# Patient Record
Sex: Male | Born: 2000 | Race: Black or African American | Hispanic: No | Marital: Single | Smoking: Current some day smoker
Health system: Southern US, Community
[De-identification: ages and names within clinical notes are randomized; demographics above are authoritative.]

---
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
## 2019-01-22 IMAGING — US US SCROTUM W/ DOPPLER COMPLETE
1 series · 13 of 25 positions shown · non-contrast
Comparison: None.

CLINICAL DATA: Initial evaluation for acute testicular pain,
fluctuance, possible abscess.

EXAM:
SCROTAL ULTRASOUND
DOPPLER ULTRASOUND OF THE TESTICLES
TECHNIQUE: Complete ultrasound examination of the testicles, epididymis, and
other scrotal structures was performed. Color and spectral Doppler
ultrasound were also utilized to evaluate blood flow to the
testicles.

[Series 1: us scrotum w/ doppler complete · 0.06mm/px · 13 of 55 slices shown]
[im 1/55]
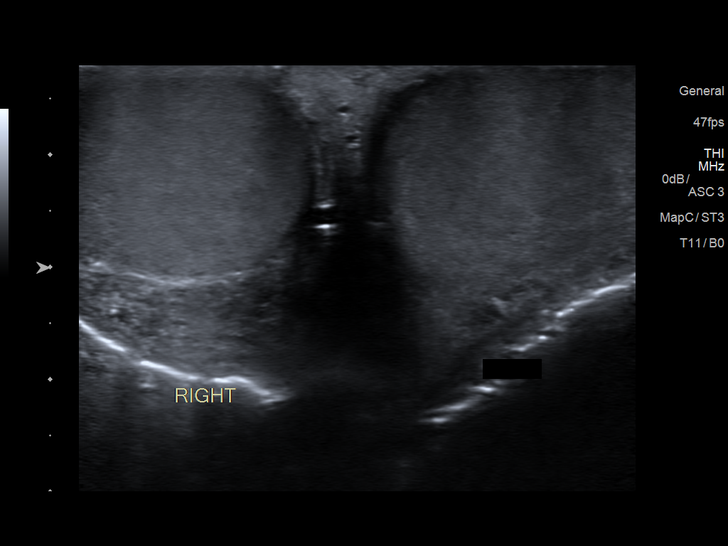
[im 5/55]
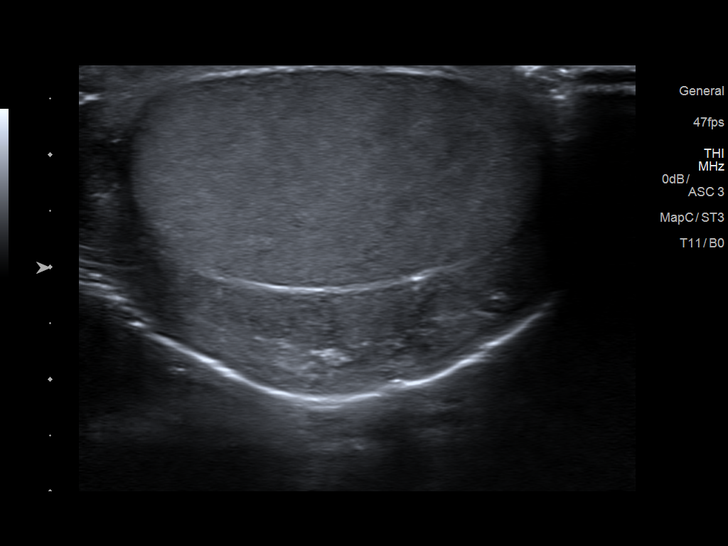
[im 10/55]
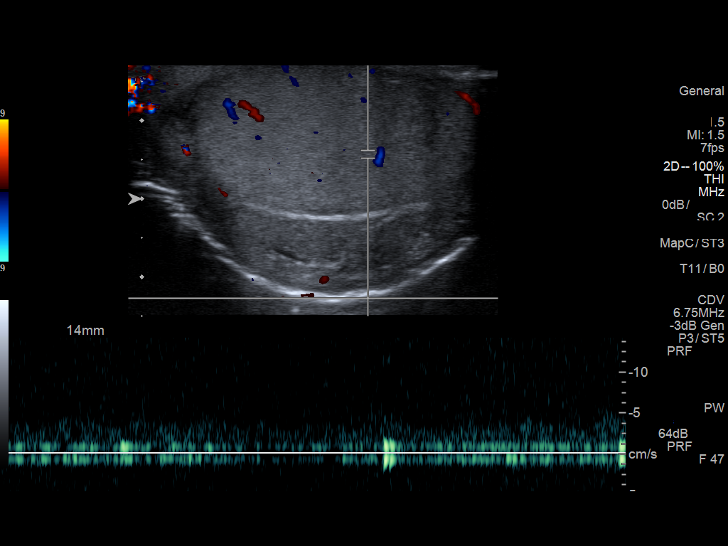
[im 14/55]
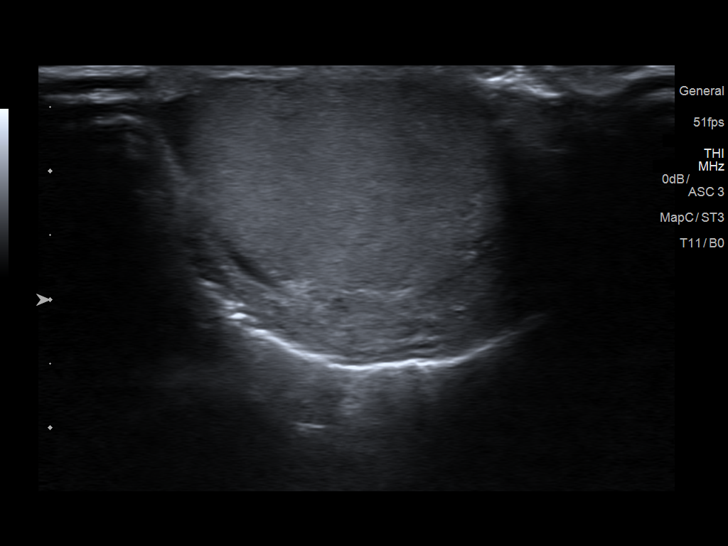
[im 19/55]
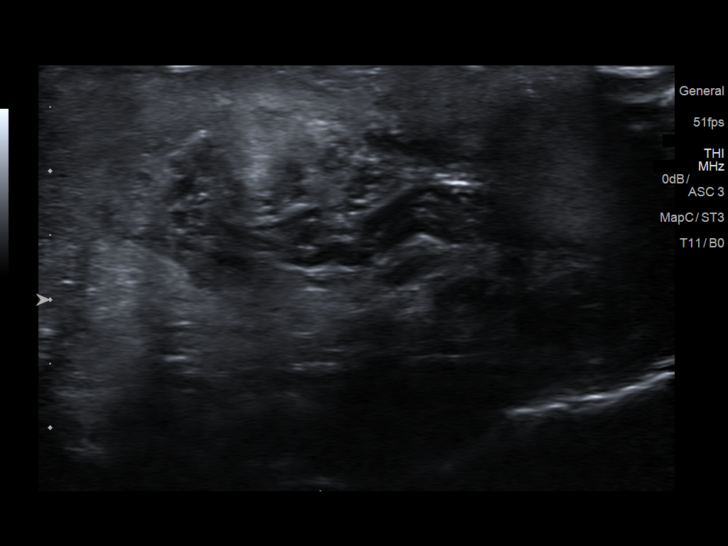
[im 23/55]
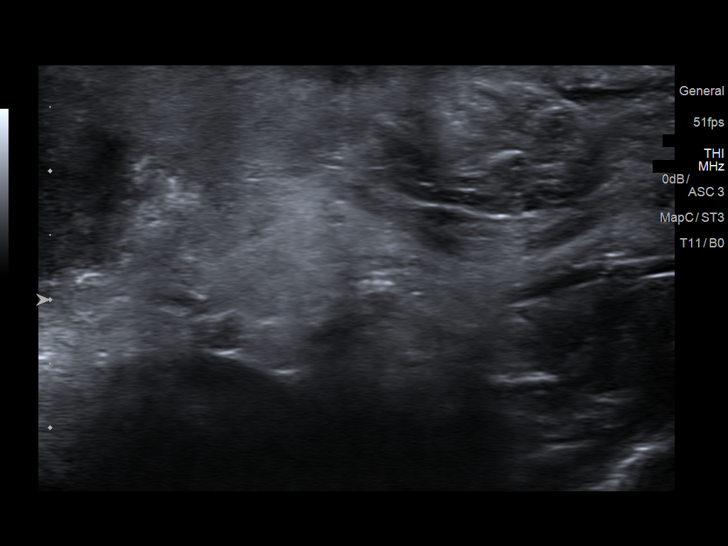
[im 28/55]
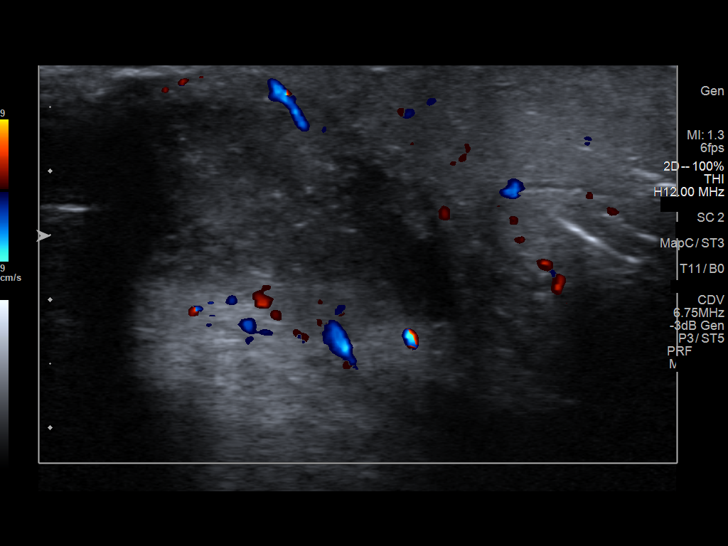
[im 32/55]
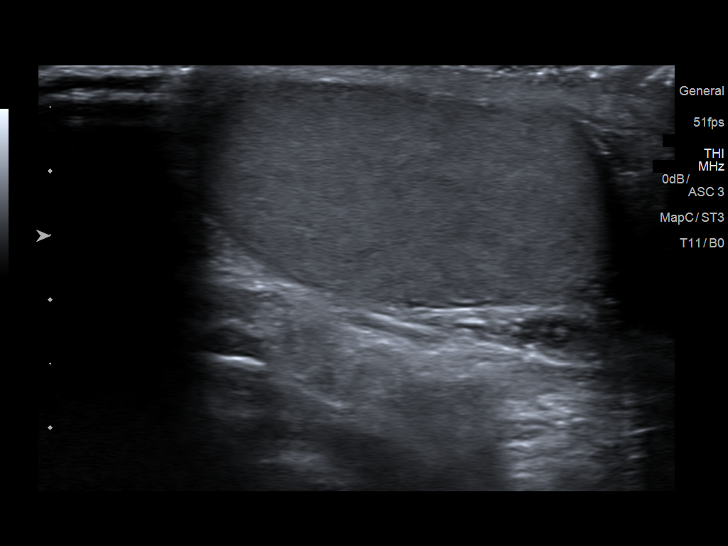
[im 37/55]
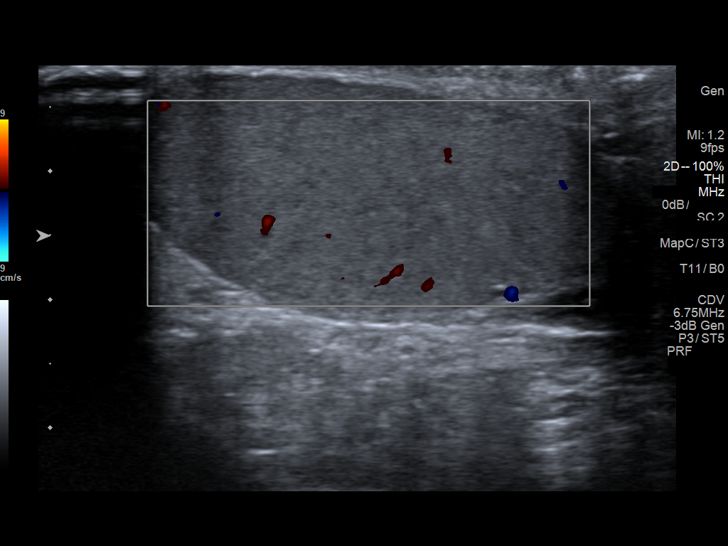
[im 41/55]
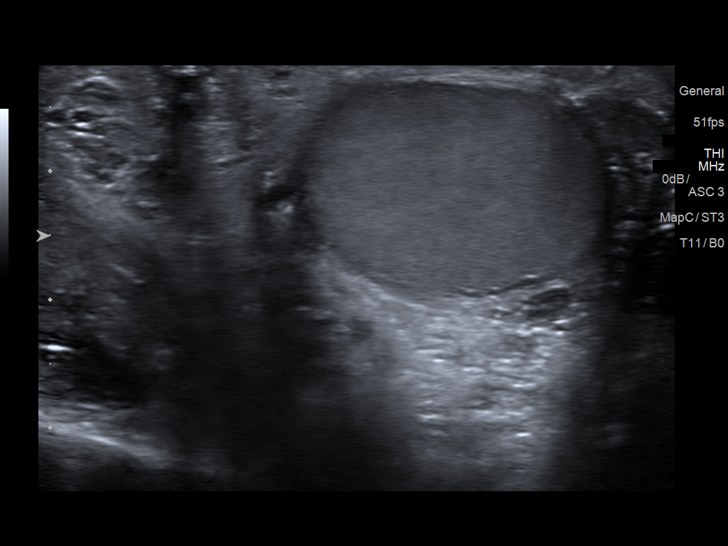
[im 46/55]
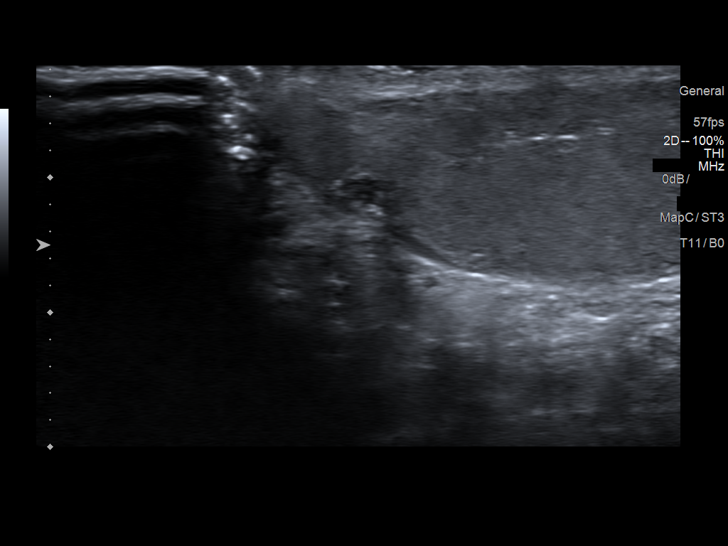
[im 50/55]
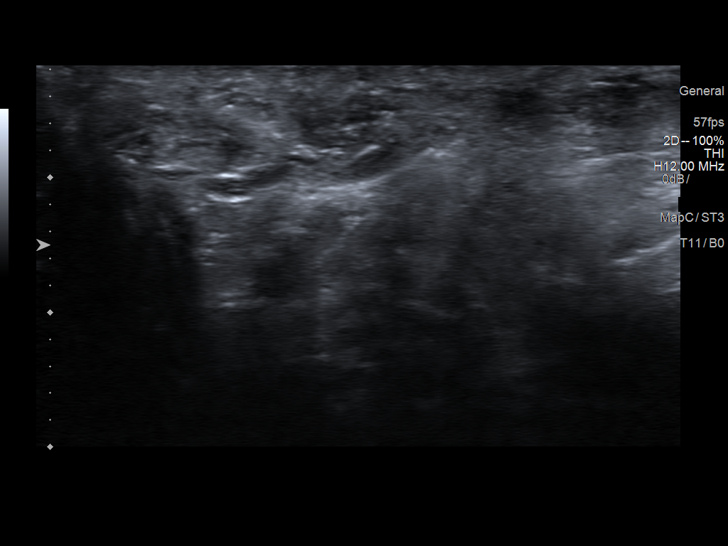
[im 55/55]
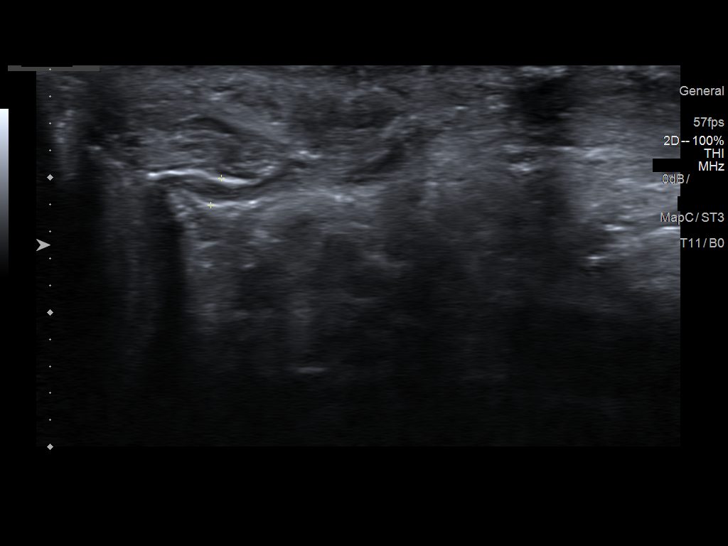

[13 of 25 positions shown; findings below may reference images not displayed]

FINDINGS: Right testicle

Measurements: 3.6 x 2.0 x 2.9 cm. No mass or microlithiasis
visualized.

Left testicle

Measurements: 3.6 x 1.7 x 2.4 cm. No mass or microlithiasis
visualized.

Right epididymis:  Normal in size and appearance.

Left epididymis: Normal in size and appearance. 4 mm simple cyst
present at the left epididymal head, felt to be incidental and of no
clinical significance.

Hydrocele:  None visualized.

Varicocele:  None visualized.

Pulsed Doppler interrogation of both testes demonstrates normal low
resistance arterial and venous waveforms bilaterally.

3.3 x 1.9 x 2.4 cm complex hypoechoic lesion/collection at the right
penile base, suspicious for phlegmon and/or early abscess. This is
positioned approximately 2 mm deep to the skin.
IMPRESSION: 1. 3.3 x 1.9 x 2.4 cm complex hypoechoic lesion/collection at the
right P now base, suspicious for phlegmon and/or early abscess.
Correlation with physical exam recommended.
2. Otherwise negative scrotal ultrasound. No evidence for acute
epididymo-orchitis.

## 2019-04-10 ENCOUNTER — Encounter (HOSPITAL_COMMUNITY): Payer: Self-pay

## 2019-04-10 ENCOUNTER — Other Ambulatory Visit: Payer: Self-pay

## 2019-04-10 ENCOUNTER — Ambulatory Visit (HOSPITAL_COMMUNITY)
Admission: EM | Admit: 2019-04-10 | Discharge: 2019-04-10 | Disposition: A | Discharge status: Home / Self Care | Payer: Medicaid Other | Attending: Family Medicine | Admitting: Family Medicine

## 2019-04-10 DIAGNOSIS — L0291 Cutaneous abscess, unspecified: Secondary | ICD-10-CM

## 2019-04-10 DIAGNOSIS — N492 Inflammatory disorders of scrotum: Secondary | ICD-10-CM

## 2019-04-10 MED ORDER — DOXYCYCLINE HYCLATE 100 MG PO CAPS: 100.0000 mg | ORAL_CAPSULE | Freq: Two times a day (BID) | ORAL | 0 refills | Status: DC

## 2019-04-10 MED ORDER — HYDROCODONE-ACETAMINOPHEN 5-325 MG PO TABS: 1.0000 | ORAL_TABLET | Freq: Four times a day (QID) | ORAL | 0 refills | Status: DC | PRN

## 2019-04-10 NOTE — Discharge Instructions (Signed)
Take the antibiotic as directed Take the pain medicine as needed This may continue to drain a couple of days Return if needed

## 2019-04-10 NOTE — ED Provider Notes (Signed)
[facility]    CSN: [id number] Arrival date & time: 04/10/19  [id number]      History   Chief Complaint Chief Complaint  Patient presents with  . Abscess    Left Leg    HPI Jonathan Vazquez is a 18 y.o. male.   HPI  Abscess on left scrotum for a week Very painful no drainage No fever    History reviewed. No pertinent past medical history.  There are no active problems to display for this patient.   History reviewed. No pertinent surgical history.     Home Medications    Prior to Admission medications   Medication Sig Start Date End Date Taking? Authorizing Provider  acetaminophen (TYLENOL) 500 MG tablet Take 500 mg by mouth every 6 (six) hours as needed. For fever    [provider]  doxycycline (VIBRAMYCIN) 100 MG capsule Take 1 capsule (100 mg total) by mouth 2 (two) times daily. 04/10/19   Eustace MooreNelson, Deundra Bard Sue, MD  HYDROcodone-acetaminophen (NORCO/VICODIN) 5-325 MG tablet Take 1 tablet by mouth every 6 (six) hours as needed for moderate pain or severe pain. 04/10/19   Eustace MooreNelson, Scotlyn Mccranie Sue, MD  ibuprofen (ADVIL,MOTRIN) 200 MG tablet Take 400 mg by mouth every 6 (six) hours as needed.    [provider]  naproxen (NAPROSYN) 375 MG tablet Take 1 tablet (375 mg total) by mouth 2 (two) times daily. 02/18/18   Rennis HardingWurst, Brittany, PA-C    Family History Family History  Family history unknown: Yes    Social History Social History   Tobacco Use  . Smoking status: Current Some Day Smoker    Types: Cigarettes  . Smokeless tobacco: Never Used  . Tobacco comment: a couple cigs/day  Substance Use Topics  . Alcohol use: Yes    Frequency: Never  . Drug use: Yes    Types: Marijuana     Allergies   Patient has no known allergies.   Review of Systems Review of Systems  Constitutional: Negative for chills and fever.  HENT: Negative for ear pain and sore throat.   Eyes: Negative for pain and visual disturbance.  Respiratory: Negative for cough and  shortness of breath.   Cardiovascular: Negative for chest pain and palpitations.  Gastrointestinal: Negative for abdominal pain and vomiting.  Genitourinary: Negative for dysuria and hematuria.  Musculoskeletal: Negative for arthralgias and back pain.  Skin: Positive for wound. Negative for color change and rash.  Neurological: Negative for seizures and syncope.  All other systems reviewed and are negative.    Physical Exam Triage Vital Signs ED Triage Vitals  Enc Vitals Group     BP 04/10/19 0828 108/62     Pulse Rate 04/10/19 0828 91     Resp 04/10/19 0828 18     Temp 04/10/19 0828 98.1 F (36.7 C)     Temp Source 04/10/19 0828 Oral     SpO2 04/10/19 0828 95 %     Weight --      Height --      Head Circumference --      Peak Flow --      Pain Score 04/10/19 0829 10     Pain Loc --      Pain Edu? --      Excl. in GC? --    No data found.  Updated Vital Signs BP 108/62 (BP Location: Left Arm)   Pulse 91   Temp 98.1 F (36.7 C) (Oral)   Resp 18   SpO2  95%   Visual Acuity Right Eye Distance:   Left Eye Distance:   Bilateral Distance:    Right Eye Near:   Left Eye Near:    Bilateral Near:     Physical Exam Constitutional:      General: He is not in acute distress.    Appearance: He is well-developed.  HENT:     Head: Normocephalic and atraumatic.  Eyes:     Conjunctiva/sclera: Conjunctivae normal.     Pupils: Pupils are equal, round, and reactive to light.  Neck:     Musculoskeletal: Normal range of motion.  Cardiovascular:     Rate and Rhythm: Normal rate.  Pulmonary:     Effort: Pulmonary effort is normal. No respiratory distress.  Abdominal:     General: There is no distension.     Palpations: Abdomen is soft.  Genitourinary:   Musculoskeletal: Normal range of motion.  Skin:    General: Skin is warm and dry.  Neurological:     Mental Status: He is alert.      UC Treatments / Results  Labs (all labs ordered are listed, but only abnormal  results are displayed) Labs Reviewed - No data to display  EKG   Radiology No results found.  Procedures Incision and Drainage  Date/Time: 04/10/2019 12:18 PM Performed by: Eustace Moore, MD Authorized by: Eustace Moore, MD   Consent:    Consent obtained:  Verbal   Consent given by:  Patient   Risks discussed:  Infection and incomplete drainage   Alternatives discussed:  No treatment Location:    Type:  Abscess   Location:  Anogenital   Anogenital location:  Scrotal space Pre-procedure details:    Skin preparation:  Betadine Anesthesia (see MAR for exact dosages):    Anesthesia method:  Local infiltration   Local anesthetic:  Lidocaine 1% w/o epi Procedure type:    Complexity:  Simple Procedure details:    Incision types:  Stab incision   Scalpel blade:  11   Wound management:  Probed and deloculated   Drainage:  Purulent   Drainage amount:  Copious Post-procedure details:    Patient tolerance of procedure:  Tolerated with difficulty   (including critical care time)  Medications Ordered in UC Medications - No data to display  Initial Impression / Assessment and Plan / UC Course  I have reviewed the triage vital signs and the nursing notes.  Pertinent labs & imaging results that were available during my care of the patient were reviewed by me and considered in my medical decision making (see chart for details).      Final Clinical Impressions(s) / UC Diagnoses   Final diagnoses:  Abscess     Discharge Instructions     Take the antibiotic as directed Take the pain medicine as needed This may continue to drain a couple of days Return if needed   ED Prescriptions    Medication Sig Dispense Auth. Provider   HYDROcodone-acetaminophen (NORCO/VICODIN) 5-325 MG tablet Take 1 tablet by mouth every 6 (six) hours as needed for moderate pain or severe pain. 8 tablet Eustace Moore, MD   doxycycline (VIBRAMYCIN) 100 MG capsule Take 1 capsule (100  mg total) by mouth 2 (two) times daily. 14 capsule Eustace Moore, MD     Controlled Substance Prescriptions [place] Controlled Substance Registry consulted? Yes, I have consulted the [place] Controlled Substances Registry for this patient, and feel the risk/benefit ratio today is favorable for proceeding with this  prescription for a controlled substance.   Raylene Everts, MD 04/10/19 [phone]

## 2019-04-10 NOTE — ED Triage Notes (Signed)
Pt presents with abscess on left leg X 1 week.

## 2019-04-21 ENCOUNTER — Ambulatory Visit (HOSPITAL_COMMUNITY)
Admission: EM | Admit: 2019-04-21 | Discharge: 2019-04-21 | Disposition: A | Discharge status: Home / Self Care | Payer: Medicaid Other | Attending: Family Medicine | Admitting: Family Medicine

## 2019-04-21 ENCOUNTER — Encounter (HOSPITAL_COMMUNITY): Payer: Self-pay | Admitting: Emergency Medicine

## 2019-04-21 ENCOUNTER — Other Ambulatory Visit: Payer: Self-pay

## 2019-04-21 DIAGNOSIS — L0291 Cutaneous abscess, unspecified: Secondary | ICD-10-CM

## 2019-04-21 MED ORDER — HYDROCODONE-ACETAMINOPHEN 5-325 MG PO TABS
1.0000 | ORAL_TABLET | Freq: Once | ORAL | Status: AC
  Administered 2019-04-21: 1 via ORAL

## 2019-04-21 MED ORDER — HYDROCODONE-ACETAMINOPHEN 5-325 MG PO TABS
ORAL_TABLET | ORAL | Status: AC
  Filled 2019-04-21: qty 1

## 2019-04-21 MED ORDER — DOXYCYCLINE HYCLATE 100 MG PO CAPS: 100.0000 mg | ORAL_CAPSULE | Freq: Two times a day (BID) | ORAL | 0 refills | Status: AC

## 2019-04-21 MED ORDER — HYDROCODONE-ACETAMINOPHEN 5-325 MG PO TABS: 1.0000 | ORAL_TABLET | ORAL | 0 refills | Status: AC | PRN

## 2019-04-21 NOTE — Discharge Instructions (Signed)
Take the medication as prescribed You need to see a specialist for this recurrent

## 2019-04-21 NOTE — ED Triage Notes (Signed)
Patient noticed abscess 1-2 weeks ago.  One I/d and did take antibiotics and another reoccurred.  This abscess is between groin and left thigh.

## 2019-04-21 NOTE — ED Provider Notes (Signed)
[facility]    CSN: [id number] Arrival date & time: 04/21/19  0859      History   Chief Complaint Chief Complaint  Patient presents with  . Abscess    HPI Jonathan Vazquez is a 18 y.o. male.   Pt is a 18 year old male that presents today with recurrent abscess.  This abscess is located to the left perineum/groin area.  History of same with scrotal abscesses.  This problem has been there for approximately 1 to 2 weeks.  The problem has worsened.  Was here a few weeks ago and had I&D done.  Denies any history of MRSA.  Denies any associated body aches, fever, chills, nausea or vomiting. He has been using warm compresses.   ROS per HPI      History reviewed. No pertinent past medical history.  There are no active problems to display for this patient.   History reviewed. No pertinent surgical history.     Home Medications    Prior to Admission medications   Medication Sig Start Date End Date Taking? Authorizing Provider  acetaminophen (TYLENOL) 500 MG tablet Take 500 mg by mouth every 6 (six) hours as needed. For fever    [provider]  doxycycline (VIBRAMYCIN) 100 MG capsule Take 1 capsule (100 mg total) by mouth 2 (two) times daily for 14 days. 04/21/19 05/05/19  Dahlia Byes A, NP  HYDROcodone-acetaminophen (NORCO/VICODIN) 5-325 MG tablet Take 1-2 tablets by mouth every 4 (four) hours as needed for up to 3 days. 04/21/19 04/24/19  Dahlia Byes A, NP  ibuprofen (ADVIL,MOTRIN) 200 MG tablet Take 400 mg by mouth every 6 (six) hours as needed.    [provider]  naproxen (NAPROSYN) 375 MG tablet Take 1 tablet (375 mg total) by mouth 2 (two) times daily. 02/18/18   Rennis Harding, PA-C    Family History Family History  Problem Relation Age of Onset  . Bipolar disorder Mother     Social History Social History   Tobacco Use  . Smoking status: Current Some Day Smoker    Types: Cigarettes  . Smokeless tobacco: Never Used  . Tobacco comment:  a couple cigs/day  Substance Use Topics  . Alcohol use: Yes    Frequency: Never  . Drug use: Yes    Types: Marijuana     Allergies   Patient has no known allergies.   Review of Systems Review of Systems   Physical Exam Triage Vital Signs ED Triage Vitals  Enc Vitals Group     BP      Pulse      Resp      Temp      Temp src      SpO2      Weight      Height      Head Circumference      Peak Flow      Pain Score      Pain Loc      Pain Edu?      Excl. in GC?    No data found.  Updated Vital Signs BP 111/71 (BP Location: Left Arm)   Pulse 89   Temp 97.6 F (36.4 C) (Temporal)   Resp 18   SpO2 98%   Visual Acuity Right Eye Distance:   Left Eye Distance:   Bilateral Distance:    Right Eye Near:   Left Eye Near:    Bilateral Near:     Physical Exam Vitals signs and  nursing note reviewed.  Constitutional:      Appearance: Normal appearance.  HENT:     Head: Normocephalic and atraumatic.     Nose: Nose normal.  Eyes:     Conjunctiva/sclera: Conjunctivae normal.  Neck:     Musculoskeletal: Normal range of motion.  Pulmonary:     Effort: Pulmonary effort is normal.  Abdominal:     Palpations: Abdomen is soft.     Tenderness: There is no abdominal tenderness.  Genitourinary:    Penis: Normal and circumcised.      Scrotum/Testes: Normal.     Epididymis:     Right: Normal.     Left: Normal.    Musculoskeletal: Normal range of motion.  Lymphadenopathy:     Lower Body: Left inguinal adenopathy present.  Skin:    General: Skin is warm and dry.  Neurological:     Mental Status: He is alert.  Psychiatric:        Mood and Affect: Mood normal.      UC Treatments / Results  Labs (all labs ordered are listed, but only abnormal results are displayed) Labs Reviewed - No data to display  EKG   Radiology No results found.  Procedures Incision and Drainage  Date/Time: 04/21/2019 11:04 AM Performed by: Janace ArisBast, Bhargav Barbaro A, NP Authorized by:  Janace ArisBast, Derinda Bartus A, NP   Consent:    Consent obtained:  Verbal   Consent given by:  Patient   Risks discussed:  Bleeding, incomplete drainage and pain   Alternatives discussed:  No treatment Universal protocol:    Patient identity confirmed:  Verbally with patient Location:    Type:  Abscess   Location:  Anogenital   Anogenital location:  Scrotal space Pre-procedure details:    Skin preparation:  Betadine Anesthesia (see MAR for exact dosages):    Anesthesia method:  Local infiltration   Local anesthetic:  Lidocaine 2% w/o epi Procedure type:    Complexity:  Complex Procedure details:    Incision types:  Single straight   Incision depth:  Subcutaneous   Scalpel blade:  11   Wound management:  Probed and deloculated   Drainage:  Purulent and bloody   Drainage amount:  Copious   Wound treatment:  Wound left open   Packing materials:  None Post-procedure details:    Patient tolerance of procedure:  Tolerated with difficulty   (including critical care time)  Medications Ordered in UC Medications  HYDROcodone-acetaminophen (NORCO/VICODIN) 5-325 MG per tablet 1 tablet (1 tablet Oral Given 04/21/19 1022)  HYDROcodone-acetaminophen (NORCO/VICODIN) 5-325 MG per tablet (has no administration in time range)    Initial Impression / Assessment and Plan / UC Course  I have reviewed the triage vital signs and the nursing notes.  Pertinent labs & imaging results that were available during my care of the patient were reviewed by me and considered in my medical decision making (see chart for details).     Abscess I&D done here in clinic.  Patient tolerated well. Malodorous purulent drainage. Hydrocodone given here in clinic for pain.  Prescription sent to pharmacy for more pain medicine as needed and doxycycline for antibiotic coverage. Recommend follow-up with specialist for recurrent abscesses Final Clinical Impressions(s) / UC Diagnoses   Final diagnoses:  Abscess     Discharge  Instructions     Take the medication as prescribed You need to see a specialist for this recurrent     ED Prescriptions    Medication Sig Dispense Auth. Provider   doxycycline (  VIBRAMYCIN) 100 MG capsule Take 1 capsule (100 mg total) by mouth 2 (two) times daily for 14 days. 28 capsule Linell Shawn A, NP   HYDROcodone-acetaminophen (NORCO/VICODIN) 5-325 MG tablet Take 1-2 tablets by mouth every 4 (four) hours as needed for up to 3 days. 12 tablet Loura Halt A, NP     Controlled Substance Prescriptions [place] Controlled Substance Registry consulted? Yes, I have consulted the [place] Controlled Substances Registry for this patient, and feel the risk/benefit ratio today is favorable for proceeding with this prescription for a controlled substance.   Orvan July, NP 04/21/19 1106

## 2019-05-22 ENCOUNTER — Emergency Department (HOSPITAL_COMMUNITY): Payer: Medicaid Other

## 2019-05-22 ENCOUNTER — Other Ambulatory Visit: Payer: Self-pay

## 2019-05-22 ENCOUNTER — Emergency Department (HOSPITAL_COMMUNITY)
Admission: EM | Admit: 2019-05-22 | Discharge: 2019-05-22 | Disposition: A | Discharge status: Home / Self Care | Payer: Medicaid Other | Attending: Emergency Medicine | Admitting: Emergency Medicine

## 2019-05-22 DIAGNOSIS — R0981 Nasal congestion: Secondary | ICD-10-CM | POA: Diagnosis not present

## 2019-05-22 DIAGNOSIS — Z720 Tobacco use: Secondary | ICD-10-CM | POA: Diagnosis not present

## 2019-05-22 DIAGNOSIS — J029 Acute pharyngitis, unspecified: Secondary | ICD-10-CM | POA: Insufficient documentation

## 2019-05-22 DIAGNOSIS — Z20828 Contact with and (suspected) exposure to other viral communicable diseases: Secondary | ICD-10-CM | POA: Diagnosis not present

## 2019-05-22 DIAGNOSIS — M7918 Myalgia, other site: Secondary | ICD-10-CM | POA: Diagnosis not present

## 2019-05-22 DIAGNOSIS — R05 Cough: Secondary | ICD-10-CM | POA: Diagnosis present

## 2019-05-22 DIAGNOSIS — Z20822 Contact with and (suspected) exposure to covid-19: Secondary | ICD-10-CM

## 2019-05-22 DIAGNOSIS — J069 Acute upper respiratory infection, unspecified: Secondary | ICD-10-CM | POA: Diagnosis not present

## 2019-05-22 NOTE — Discharge Instructions (Signed)
Recommend Tylenol, Motrin as needed for symptom control.  Recommend following up with your primary doctor as needed.  If you develop chest pain, difficulty breathing, or other new concerning symptoms recommend return here for reassessment.  Please follow isolation precautions until the results of your coronavirus test have come back.

## 2019-05-22 NOTE — ED Provider Notes (Signed)
[facility] DEPART[facility] Provider Note   CSN: [id number] Arrival date & time: 05/22/19  0509     History   Chief Complaint Chief Complaint  Patient presents with  . Cold Symptoms    HPI Jonathan Vazquez is a 18 y.o. male.  Presents to ER with cold symptoms.  Patient reports symptoms have been going on since yesterday morning.  He has noticed increased nasal congestion, cough, mild sore throat.  Reports that his cough is normally nonproductive but has noticed small clear phlegm, no blood.  He has not had any difficulty with swallowing, no voice change.  No associated difficulty breathing.  Has also had generalized body aches, no chest pain.  Reports having as part of generalized body aches pain across his back, generalized, not isolated to his midline.  No numbness, weakness, bladder or bowel incontinence, saddle anesthesia.  No fever but had some chills last night  Denies any chronic medical problems.  Denies known exposures to COVID-19.     HPI  No past medical history on file.  There are no active problems to display for this patient.   No past surgical history on file.      Home Medications    Prior to Admission medications   Medication Sig Start Date End Date Taking? Authorizing Provider  acetaminophen (TYLENOL) 500 MG tablet Take 500 mg by mouth every 6 (six) hours as needed. For fever    [provider]  ibuprofen (ADVIL,MOTRIN) 200 MG tablet Take 400 mg by mouth every 6 (six) hours as needed.    [provider]  naproxen (NAPROSYN) 375 MG tablet Take 1 tablet (375 mg total) by mouth 2 (two) times daily. 02/18/18   Lestine Box, PA-C    Family History Family History  Problem Relation Age of Onset  . Bipolar disorder Mother     Social History Social History   Tobacco Use  . Smoking status: Current Some Day Smoker    Types: Cigarettes  . Smokeless tobacco: Never Used  . Tobacco comment: a couple cigs/day   Substance Use Topics  . Alcohol use: Yes    Frequency: Never  . Drug use: Yes    Types: Marijuana     Allergies   Patient has no known allergies.   Review of Systems Review of Systems  Constitutional: Positive for chills. Negative for fever.  HENT: Positive for sore throat. Negative for ear pain.   Eyes: Negative for pain and visual disturbance.  Respiratory: Positive for cough. Negative for shortness of breath.   Cardiovascular: Negative for chest pain and palpitations.  Gastrointestinal: Negative for abdominal pain and vomiting.  Genitourinary: Negative for dysuria and hematuria.  Musculoskeletal: Negative for arthralgias and back pain.  Skin: Negative for color change and rash.  Neurological: Negative for seizures and syncope.  All other systems reviewed and are negative.    Physical Exam Updated Vital Signs BP 109/68 (BP Location: Right Arm)   Pulse 65   Temp 98.9 F (37.2 C) (Oral)   Resp 15   SpO2 98%   Physical Exam Vitals signs and nursing note reviewed.  Constitutional:      Appearance: He is well-developed.  HENT:     Head: Normocephalic and atraumatic.     Nose: Congestion present.     Mouth/Throat:     Mouth: Mucous membranes are moist.     Pharynx: No oropharyngeal exudate or posterior oropharyngeal erythema.  Eyes:     Conjunctiva/sclera: Conjunctivae normal.  Pupils: Pupils are equal, round, and reactive to light.  Neck:     Musculoskeletal: Neck supple.  Cardiovascular:     Rate and Rhythm: Normal rate and regular rhythm.     Heart sounds: No murmur.  Pulmonary:     Effort: Pulmonary effort is normal. No respiratory distress.     Breath sounds: Normal breath sounds. No stridor. No wheezing or rhonchi.  Abdominal:     Palpations: Abdomen is soft.     Tenderness: There is no abdominal tenderness.  Musculoskeletal:     Comments: No midline C, T, L spine tenderness  Skin:    General: Skin is warm and dry.     Capillary Refill: Capillary  refill takes less than 2 seconds.  Neurological:     General: No focal deficit present.     Mental Status: He is alert and oriented to person, place, and time.  Psychiatric:        Mood and Affect: Mood normal.        Behavior: Behavior normal.      ED Treatments / Results  Labs (all labs ordered are listed, but only abnormal results are displayed) Labs Reviewed - No data to display  EKG None  Radiology Dg Chest 2 View  Result Date: 05/22/2019 CLINICAL DATA:  Cough, congestion EXAM: CHEST - 2 VIEW COMPARISON:  10/25/2011 radiograph FINDINGS: No consolidation, features of edema, pneumothorax, or effusion. Pulmonary vascularity is normally distributed. The cardiomediastinal contours are unremarkable. No acute osseous or soft tissue abnormality. IMPRESSION: No acute cardiopulmonary abnormality. Electronically Signed   By: Kreg Shropshire M.D.   On: 05/22/2019 05:58    Procedures Procedures (including critical care time)  Medications Ordered in ED Medications - No data to display   Initial Impression / Assessment and Plan / ED Course  I have reviewed the triage vital signs and the nursing notes.  Pertinent labs & imaging results that were available during my care of the patient were reviewed by me and considered in my medical decision making (see chart for details).        Patient-year-old male with no significant past medical history presents to ER with cough, congestion.  On exam patient was noted to be very well-appearing, normal vital signs, clear lungs.  Did not notice significant erythema or exudates in posterior oropharynx.  Based constellation of symptoms high suspicion for viral upper respiratory illness.  Believe he is appropriate for outpatient management at this time.  Do not see indication for antibiotics at this time.  Will check for COVID-19.  Reviewed return precautions with patient, isolation precautions.    After the discussed management above, the patient was  determined to be safe for discharge.  The patient was in agreement with this plan and all questions regarding their care were answered.  ED return precautions were discussed and the patient will return to the ED with any significant worsening of condition.      Final Clinical Impressions(s) / ED Diagnoses   Final diagnoses:  Viral URI with cough  Suspected COVID-19 virus infection    ED Discharge Orders    None       Milagros Loll, MD 05/22/19 [phone]

## 2019-05-22 NOTE — ED Triage Notes (Signed)
Patient reports cold symptoms onset yesterday with congestion, runny nose, chills, cough with phlegm, and slight sore throat. 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 Hasn't tried any OTC meds. Resp e/u. Denies known sick contacts or covid exposure.

## 2019-05-24 LAB — NOVEL CORONAVIRUS, NAA (HOSP ORDER, SEND-OUT TO REF LAB; TAT 18-24 HRS): SARS-CoV-2, NAA: NOT DETECTED

## 2021-07-09 ENCOUNTER — Emergency Department (HOSPITAL_COMMUNITY)
Admission: EM | Admit: 2021-07-09 | Discharge: 2021-07-09 | Disposition: A | Discharge status: Home / Self Care | Payer: Medicaid Other | Attending: Emergency Medicine | Admitting: Emergency Medicine

## 2021-07-09 ENCOUNTER — Encounter (HOSPITAL_COMMUNITY): Payer: Self-pay | Admitting: Emergency Medicine

## 2021-07-09 DIAGNOSIS — Z113 Encounter for screening for infections with a predominantly sexual mode of transmission: Secondary | ICD-10-CM | POA: Diagnosis not present

## 2021-07-09 DIAGNOSIS — L739 Follicular disorder, unspecified: Secondary | ICD-10-CM | POA: Insufficient documentation

## 2021-07-09 DIAGNOSIS — F17210 Nicotine dependence, cigarettes, uncomplicated: Secondary | ICD-10-CM | POA: Insufficient documentation

## 2021-07-09 LAB — URINALYSIS, ROUTINE W REFLEX MICROSCOPIC
Bacteria, UA: NONE SEEN
Bilirubin Urine: NEGATIVE
Glucose, UA: NEGATIVE mg/dL
Hgb urine dipstick: NEGATIVE
Ketones, ur: NEGATIVE mg/dL
Leukocytes,Ua: NEGATIVE
Nitrite: NEGATIVE
Protein, ur: NEGATIVE mg/dL
Specific Gravity, Urine: 1.025 (ref 1.005–1.030)
pH: 6 (ref 5.0–8.0)

## 2021-07-09 LAB — RAPID HIV SCREEN (HIV 1/2 AB+AG)
HIV 1/2 Antibodies: NONREACTIVE
HIV-1 P24 Antigen - HIV24: NONREACTIVE

## 2021-07-09 MED ORDER — DOXYCYCLINE HYCLATE 100 MG PO CAPS: 100.0000 mg | ORAL_CAPSULE | Freq: Two times a day (BID) | ORAL | 0 refills | Status: AC

## 2021-07-09 NOTE — Discharge Instructions (Addendum)
You were seen here today for STD screening as well as an area of swelling on your scrotum. This is likely folliculitis, there is no sign of abscess or need for draining at this time. Please apply warm compresses to the area multiple times daily. You have been started on Doxycycline, an antibiotic, that you will need to take as prescribed and complete the entirety of the course. Please follow up with your MyChart for your STD results. You will need to abstain from sex until you see the results. If you have any concern, new or worsening symptoms, please return the the ER for re-revaluation.

## 2021-07-09 NOTE — ED Provider Notes (Signed)
[facility] DEPT Provider Note   CSN: [id number] Arrival date & time: 07/09/21  1129     History Chief Complaint  Patient presents with   Abscess    Jonathan Vazquez is a 20 y.o. male presents to the ED for evaluation of a scrotal abscess and STD screening. The patient reports he has noticed a returning abscess from a few years ago on the right side of his scrotum. Denies any drainage, surrounding warmth, or erythema to the wound.  Denies any fevers.  Patient reports he had it drained 2 years ago and was put on antibiotics and has not any problems with this since.  Additionally, the patient reports he would like to be screened for all STDs.  He denies any scrotal pain, scrotal swelling, testicular pain, scrotal swelling, penile discharge, dysuria, or hematuria.  He denies any concerns for STDs at this time, but would still like to be screened.  He denied treatment at this time.  No known drug allergies.  Patient is a daily smoker and marijuana user.  knapp   Abscess     History reviewed. No pertinent past medical history.  There are no problems to display for this patient.   History reviewed. No pertinent surgical history.     Family History  Problem Relation Age of Onset   Bipolar disorder Mother     Social History   Tobacco Use   Smoking status: Some Days    Types: Cigarettes   Smokeless tobacco: Never   Tobacco comments:    a couple cigs/day  Vaping Use   Vaping Use: Some days  Substance Use Topics   Alcohol use: Yes   Drug use: Yes    Types: Marijuana    Home Medications Prior to Admission medications   Medication Sig Start Date End Date Taking? Authorizing Provider  doxycycline (VIBRAMYCIN) 100 MG capsule Take 1 capsule (100 mg total) by mouth 2 (two) times daily. 07/09/21  Yes Sherrell Puller, PA-C  acetaminophen (TYLENOL) 500 MG tablet Take 500 mg by mouth every 6 (six) hours as needed. For fever    [provider]   ibuprofen (ADVIL,MOTRIN) 200 MG tablet Take 400 mg by mouth every 6 (six) hours as needed.    [provider]  naproxen (NAPROSYN) 375 MG tablet Take 1 tablet (375 mg total) by mouth 2 (two) times daily. 02/18/18   Wurst, [place], PA-C    Allergies    Patient has no known allergies.  Review of Systems   Review of Systems  Genitourinary:  Negative for dysuria and hematuria.       See HPI  Skin:  Positive for wound.  All other systems reviewed and are negative.  Physical Exam Updated Vital Signs BP 126/78   Pulse 97   Temp 98.3 F (36.8 C) (Oral)   Resp 16   SpO2 99%   Physical Exam Vitals and nursing note reviewed. Exam conducted with a chaperone present (DJ, EMT).  Constitutional:      General: He is not in acute distress.    Appearance: Normal appearance. He is not toxic-appearing.  HENT:     Head: Normocephalic and atraumatic.     Mouth/Throat:     Mouth: Mucous membranes are moist.  Eyes:     General: No scleral icterus. Cardiovascular:     Rate and Rhythm: Normal rate.  Pulmonary:     Effort: Pulmonary effort is normal. No respiratory distress.     Breath sounds: Normal breath  sounds.  Abdominal:     Hernia: There is no hernia in the left inguinal area or right inguinal area.  Genitourinary:    Pubic Area: No rash.      Penis: Normal and circumcised. No tenderness, discharge, swelling or lesions.      Testes: Normal.        Right: Mass, tenderness or swelling not present.        Left: Mass, tenderness or swelling not present.     Epididymis:     Right: Normal.     Left: Normal.       Comments: Small, approximately lima Vazquez in size, area of induration consistent with scar tissue, or re-occurring cyst. No fluctuance present. No red streaking. Non-tender to palpation.  Skin:    General: Skin is dry.     Findings: No rash.  Neurological:     General: No focal deficit present.     Mental Status: He is alert. Mental status is at baseline.   Psychiatric:        Mood and Affect: Mood normal.    ED Results / Procedures / Treatments   Labs (all labs ordered are listed, but only abnormal results are displayed) Labs Reviewed  HIV ANTIBODY (ROUTINE TESTING W REFLEX)  RAPID HIV SCREEN (HIV 1/2 AB+AG)  RPR  URINALYSIS, ROUTINE W REFLEX MICROSCOPIC  GC/CHLAMYDIA PROBE AMP ([place]) NOT AT [facility]    EKG None  Radiology No results found.  Procedures Procedures   Medications Ordered in ED Medications - No data to display  ED Course  I have reviewed the triage vital signs and the nursing notes.  Pertinent labs & imaging results that were available during my care of the patient were reviewed by me and considered in my medical decision making (see chart for details).  20 y/o M presents to the ED for eval of possible abscess along with desiring an STD screen.  Discussed with patient that I could prophylactically treat him for gonorrhea and chlamydia, patient reports he is concerned for any STDs, would still like testing.  Recommended to follow-up with his MyChart for possible positive results and need for treatment.  Patient agrees that he will follow-up.  Additionally, the patient reports a small "abscess" on his scrotum.  On examination, with chaperone present, the patient has a small, approximately lima Vazquez in size, area of induration that is possibly consistent with scar tissue or a small cyst.  No signs of fluctuance, drainage, overlying skin changes, erythema, or reactive lymphadenopathy.  No penile drip.  2 descended testicles.  No hernias palpated.  Possible folliculitis versus cyst.  We will place patient on doxycycline as that was the antibiotic he was on last year 2 years ago reports work for him.  Recommended the patient follow-up with a possible general surgeon as he may need to have this removed.  Discussed with the patient that at this time there is no need to be incision, although if the cyst becomes inflamed or  infected he may need to return to have it incised.  I advised the patient to abstain from sexual intercourse until his STD results come back.  Return precautions discussed.  Patient agrees to plan.  Patient is stable being discharged home in good condition.    MDM Rules/Calculators/A&P                          Final Clinical Impression(s) / ED Diagnoses Final diagnoses:  Folliculitis  Screen for STD (sexually transmitted disease)    Rx / DC Orders ED Discharge Orders          Ordered    doxycycline (VIBRAMYCIN) 100 MG capsule  2 times daily        07/09/21 1314             Sherrell Puller, [place] 07/09/21 1330    Dorie Rank, MD 07/10/21 [phone]

## 2021-07-09 NOTE — ED Triage Notes (Signed)
PT c/o recurring abscess to R groin x 3-4 years. States tenderness to area x3 days. Also requesting STD check.

## 2021-07-10 LAB — RPR: RPR Ser Ql: NONREACTIVE

## 2021-07-10 LAB — HIV ANTIBODY (ROUTINE TESTING W REFLEX): HIV Screen 4th Generation wRfx: NONREACTIVE

## 2021-08-14 IMAGING — CR DG CHEST 2V
2 series · 2 of 2 positions shown · non-contrast
Comparison: 10/25/2011 radiograph

CLINICAL DATA: Cough, congestion

EXAM:
CHEST - 2 VIEW

[chest pa]
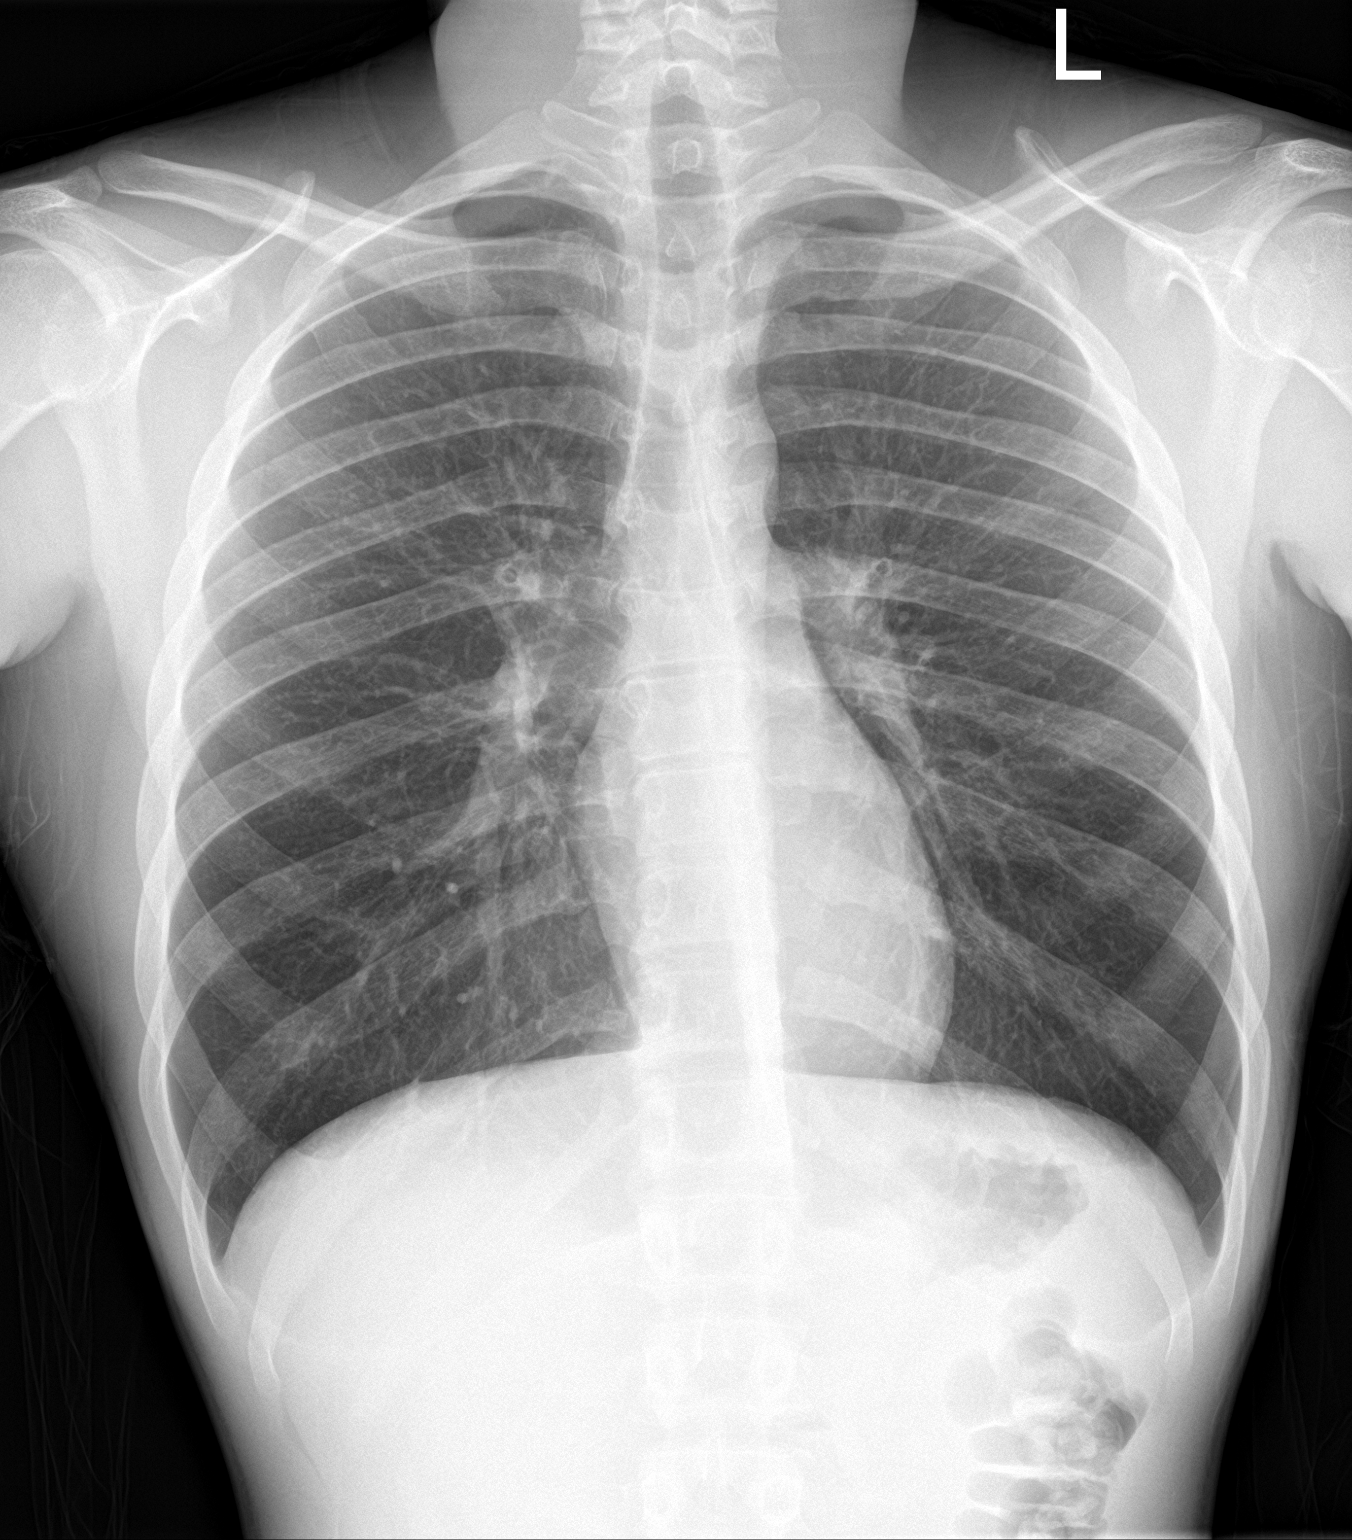

[chest lat]
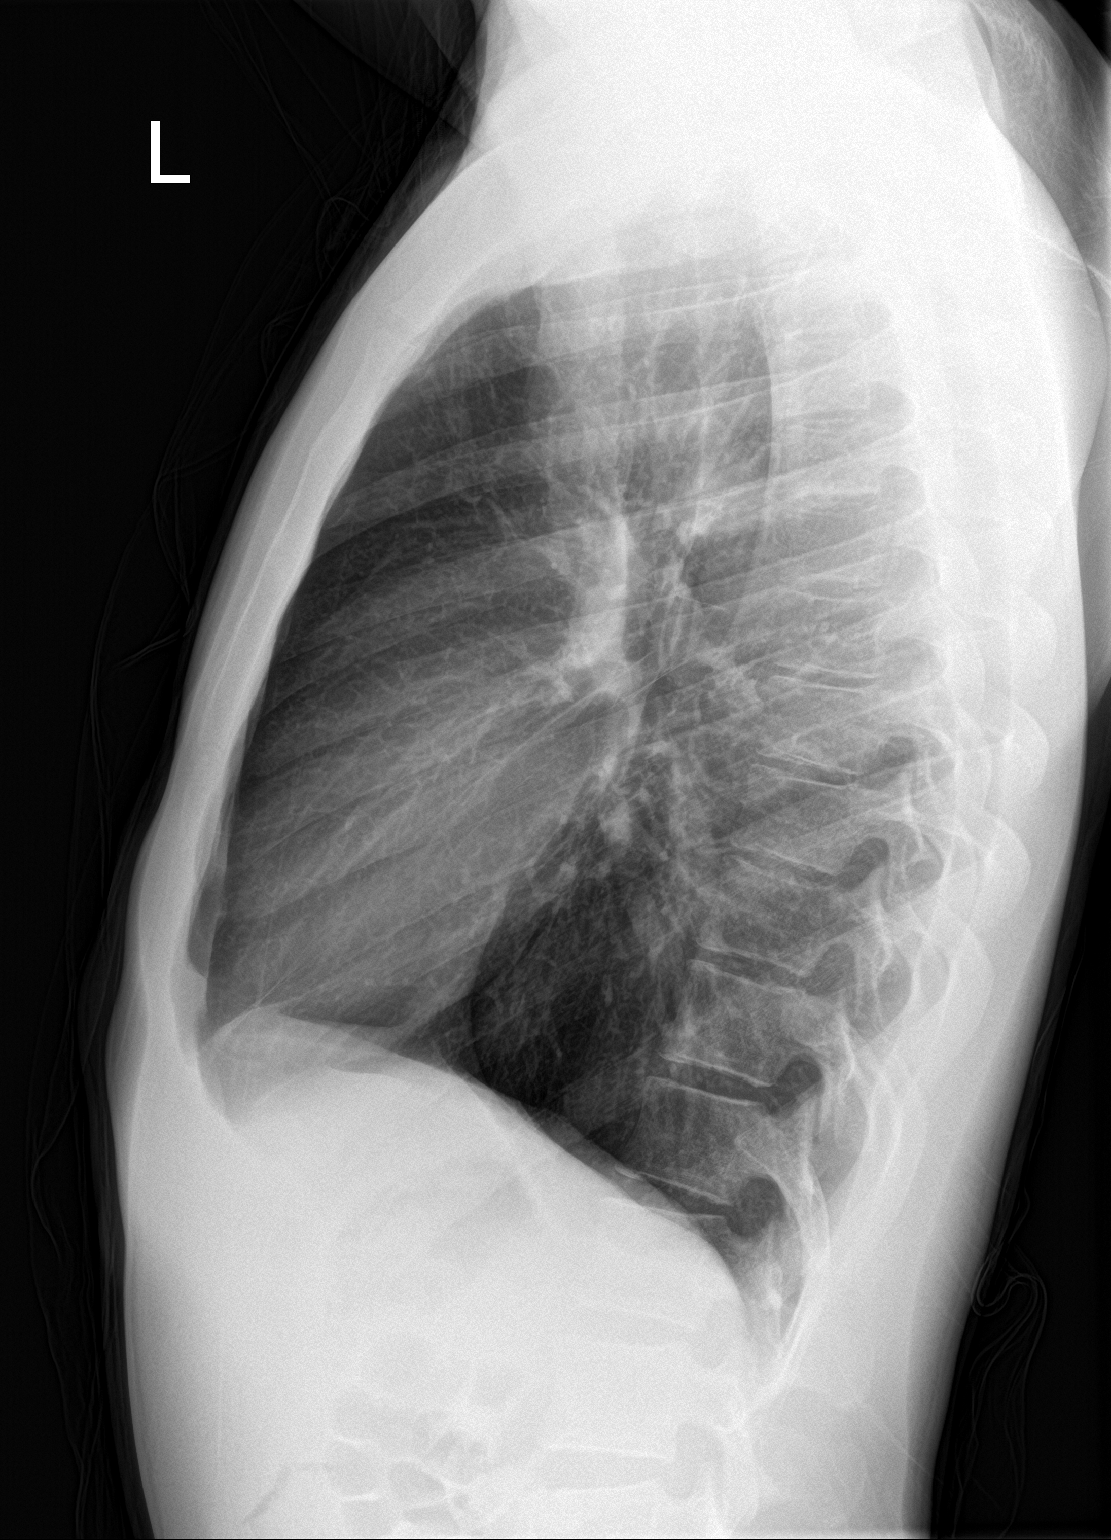

[2 of 2 positions shown; findings below may reference images not displayed]

FINDINGS: No consolidation, features of edema, pneumothorax, or effusion.
Pulmonary vascularity is normally distributed. The cardiomediastinal
contours are unremarkable. No acute osseous or soft tissue
abnormality.
IMPRESSION: No acute cardiopulmonary abnormality.

## 2022-07-10 ENCOUNTER — Encounter (HOSPITAL_COMMUNITY): Payer: Self-pay

## 2022-07-10 ENCOUNTER — Ambulatory Visit (HOSPITAL_COMMUNITY)
Admission: EM | Admit: 2022-07-10 | Discharge: 2022-07-10 | Disposition: A | Discharge status: Home / Self Care | Payer: Medicaid Other | Attending: Internal Medicine | Admitting: Internal Medicine

## 2022-07-10 ENCOUNTER — Ambulatory Visit (INDEPENDENT_AMBULATORY_CARE_PROVIDER_SITE_OTHER): Payer: Medicaid Other

## 2022-07-10 DIAGNOSIS — M79642 Pain in left hand: Secondary | ICD-10-CM

## 2022-07-10 DIAGNOSIS — M79641 Pain in right hand: Secondary | ICD-10-CM

## 2022-07-10 DIAGNOSIS — G8929 Other chronic pain: Secondary | ICD-10-CM
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
## 2022-07-10 NOTE — Discharge Instructions (Signed)
Your x-rays of your right hand were negative for fracture or dislocation. Your hand pain is likely related to overuse and inflammation. I would like you to continue using antiinflammatory medications to help with your hand pain as needed.  Purchase a stress ball to use to help with arthritis-like symptoms and use this daily.   Rest, ice, and elevate your hands to reduce inflammation and pain.  Call the orthopedic provider listed on your discharge paperwork to schedule a follow-up appointment if your symptoms do not improve in the next 1-2 weeks with supportive care.  Return to urgent care if you experience worsening pain, numbness, tingling, change of color in your skin near the injury, or any other concerning symptoms.  I hope you feel better!
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
## 2023-05-02 ENCOUNTER — Other Ambulatory Visit: Payer: Self-pay

## 2023-05-02 ENCOUNTER — Emergency Department (HOSPITAL_COMMUNITY)
Admission: EM | Admit: 2023-05-02 | Discharge: 2023-05-02 | Discharge status: Against Medical Advice | Payer: 59 | Attending: Emergency Medicine | Admitting: Emergency Medicine

## 2023-05-02 DIAGNOSIS — R197 Diarrhea, unspecified: Secondary | ICD-10-CM | POA: Diagnosis not present

## 2023-05-02 DIAGNOSIS — Z5321 Procedure and treatment not carried out due to patient leaving prior to being seen by health care provider: Secondary | ICD-10-CM | POA: Insufficient documentation

## 2023-05-02 DIAGNOSIS — R1030 Lower abdominal pain, unspecified: Secondary | ICD-10-CM | POA: Diagnosis not present

## 2023-05-02 DIAGNOSIS — R110 Nausea: Secondary | ICD-10-CM | POA: Insufficient documentation

## 2023-05-02 DIAGNOSIS — R109 Unspecified abdominal pain: Secondary | ICD-10-CM | POA: Diagnosis present

## 2023-05-02 DIAGNOSIS — R42 Dizziness and giddiness: Secondary | ICD-10-CM | POA: Insufficient documentation

## 2023-05-02 LAB — COMPREHENSIVE METABOLIC PANEL
ALT: 24 U/L (ref 0–44)
AST: 27 U/L (ref 15–41)
Albumin: 3.7 g/dL (ref 3.5–5.0)
Alkaline Phosphatase: 66 U/L (ref 38–126)
Anion gap: 8 (ref 5–15)
BUN: 13 mg/dL (ref 6–20)
CO2: 28 mmol/L (ref 22–32)
Calcium: 8.7 mg/dL — ABNORMAL LOW (ref 8.9–10.3)
Chloride: 101 mmol/L (ref 98–111)
Creatinine, Ser: 1.09 mg/dL (ref 0.61–1.24)
GFR, Estimated: >60 mL/min (ref >60)
Glucose, Bld: 112 mg/dL — ABNORMAL HIGH (ref 70–99)
Potassium: 3.7 mmol/L (ref 3.5–5.1)
Sodium: 137 mmol/L (ref 135–145)
Total Bilirubin: 1.1 mg/dL (ref 0.3–1.2)
Total Protein: 6.4 g/dL — ABNORMAL LOW (ref 6.5–8.1)

## 2023-05-02 LAB — CBC
HCT: 52.5 % — ABNORMAL HIGH (ref 39.0–52.0)
Hemoglobin: 17.5 g/dL — ABNORMAL HIGH (ref 13.0–17.0)
MCH: 30.5 pg (ref 26.0–34.0)
MCHC: 33.3 g/dL (ref 30.0–36.0)
MCV: 91.6 fL (ref 80.0–100.0)
Platelets: 223 10*3/uL (ref 150–400)
RBC: 5.73 MIL/uL (ref 4.22–5.81)
RDW: 12.9 % (ref 11.5–15.5)
WBC: 11 10*3/uL — ABNORMAL HIGH (ref 4.0–10.5)
nRBC: 0 % (ref 0.0–0.2)

## 2023-05-02 LAB — LIPASE, BLOOD: Lipase: 23 U/L (ref 11–51)

## 2023-05-02 MED ORDER — ONDANSETRON 4 MG PO TBDP: 4.0000 mg | ORAL_TABLET | Freq: Once | ORAL | Status: DC | PRN

## 2023-05-02 NOTE — ED Notes (Signed)
Pt remains in bathroom.

## 2023-05-02 NOTE — ED Notes (Signed)
No answer for room assignment

## 2023-05-02 NOTE — ED Triage Notes (Signed)
Pt c/o abdominal pain x 1 hour associated with nausea and diarrhea

## 2023-05-02 NOTE — ED Notes (Signed)
Pt aware urine specimen needed urine cup given

## 2023-05-02 NOTE — ED Triage Notes (Signed)
[facility] report: pt coming from work d/t c/o severe sharp mid lower abdominal pain. Associated with nausea and lightheaded. Per note pt donated blood earlier this morning. Endorses THC use.

## 2023-05-03 ENCOUNTER — Encounter (HOSPITAL_COMMUNITY): Payer: Self-pay

## 2023-05-03 ENCOUNTER — Emergency Department (HOSPITAL_COMMUNITY)
Admission: EM | Admit: 2023-05-03 | Discharge: 2023-05-03 | Disposition: A | Discharge status: Home / Self Care | Payer: 59 | Attending: Emergency Medicine | Admitting: Emergency Medicine

## 2023-05-03 ENCOUNTER — Emergency Department (HOSPITAL_COMMUNITY): Payer: 59

## 2023-05-03 ENCOUNTER — Emergency Department (HOSPITAL_COMMUNITY)
Admission: EM | Admit: 2023-05-03 | Discharge: 2023-05-03 | Disposition: A | Discharge status: Home / Self Care | Payer: 59 | Source: Home / Self Care | Attending: Emergency Medicine | Admitting: Emergency Medicine

## 2023-05-03 ENCOUNTER — Other Ambulatory Visit: Payer: Self-pay

## 2023-05-03 DIAGNOSIS — D72829 Elevated white blood cell count, unspecified: Secondary | ICD-10-CM | POA: Insufficient documentation

## 2023-05-03 DIAGNOSIS — K529 Noninfective gastroenteritis and colitis, unspecified: Secondary | ICD-10-CM | POA: Insufficient documentation

## 2023-05-03 DIAGNOSIS — R112 Nausea with vomiting, unspecified: Secondary | ICD-10-CM | POA: Insufficient documentation

## 2023-05-03 DIAGNOSIS — R1030 Lower abdominal pain, unspecified: Secondary | ICD-10-CM | POA: Diagnosis present

## 2023-05-03 DIAGNOSIS — R1084 Generalized abdominal pain: Secondary | ICD-10-CM | POA: Insufficient documentation

## 2023-05-03 DIAGNOSIS — R197 Diarrhea, unspecified: Secondary | ICD-10-CM | POA: Insufficient documentation

## 2023-05-03 DIAGNOSIS — K838 Other specified diseases of biliary tract: Secondary | ICD-10-CM | POA: Diagnosis not present

## 2023-05-03 LAB — URINALYSIS, W/ REFLEX TO CULTURE (INFECTION SUSPECTED)
Bilirubin Urine: NEGATIVE
Glucose, UA: NEGATIVE mg/dL
Hgb urine dipstick: NEGATIVE
Ketones, ur: 20 mg/dL — AB
Leukocytes,Ua: NEGATIVE
Nitrite: NEGATIVE
Protein, ur: NEGATIVE mg/dL
Specific Gravity, Urine: 1.011 (ref 1.005–1.030)
pH: 6 (ref 5.0–8.0)

## 2023-05-03 MED ORDER — SODIUM CHLORIDE 0.9 % IV BOLUS
1000.0000 mL | Freq: Once | INTRAVENOUS | Status: AC
  Administered 2023-05-03: 1000 mL via INTRAVENOUS

## 2023-05-03 MED ORDER — MORPHINE SULFATE (PF) 4 MG/ML IV SOLN
4.0000 mg | Freq: Once | INTRAVENOUS | Status: AC
  Administered 2023-05-03: 4 mg via INTRAVENOUS
  Filled 2023-05-03: qty 1

## 2023-05-03 MED ORDER — IOHEXOL 300 MG/ML  SOLN
100.0000 mL | Freq: Once | INTRAMUSCULAR | Status: AC | PRN
  Administered 2023-05-03: 100 mL via INTRAVENOUS

## 2023-05-03 MED ORDER — ONDANSETRON HCL 4 MG/2ML IJ SOLN
4.0000 mg | Freq: Once | INTRAMUSCULAR | Status: AC
  Administered 2023-05-03: 4 mg via INTRAVENOUS
  Filled 2023-05-03: qty 2

## 2023-05-03 MED ORDER — MORPHINE SULFATE (PF) 2 MG/ML IV SOLN
2.0000 mg | Freq: Once | INTRAVENOUS | Status: AC
  Administered 2023-05-03: 2 mg via INTRAVENOUS
  Filled 2023-05-03: qty 1

## 2023-05-03 MED ORDER — PANTOPRAZOLE SODIUM 40 MG IV SOLR
40.0000 mg | Freq: Once | INTRAVENOUS | Status: AC
  Administered 2023-05-03: 40 mg via INTRAVENOUS
  Filled 2023-05-03: qty 10

## 2023-05-03 MED ORDER — DICYCLOMINE HCL 10 MG/ML IM SOLN
20.0000 mg | Freq: Once | INTRAMUSCULAR | Status: AC
  Administered 2023-05-03: 20 mg via INTRAMUSCULAR
  Filled 2023-05-03: qty 2

## 2023-05-03 MED ORDER — DICYCLOMINE HCL 20 MG PO TABS: 20.0000 mg | ORAL_TABLET | Freq: Two times a day (BID) | ORAL | 0 refills | Status: AC

## 2023-05-03 MED ORDER — ONDANSETRON HCL 4 MG PO TABS: 4.0000 mg | ORAL_TABLET | Freq: Four times a day (QID) | ORAL | 0 refills | Status: AC

## 2023-05-03 MED ORDER — SODIUM CHLORIDE (PF) 0.9 % IJ SOLN
INTRAMUSCULAR | Status: AC
  Filled 2023-05-03: qty 50

## 2023-05-03 NOTE — ED Triage Notes (Signed)
Pt returns for evaluation for ongoing abdominal pain with nausea and diarrhea after LWBS after triage.

## 2023-05-03 NOTE — ED Notes (Signed)
Pt given ice water at this time.  

## 2023-05-03 NOTE — ED Triage Notes (Addendum)
Return for reevaluation of lower mid abdominal pain. Negative CT and labs yesterday. C/o N/V/D.

## 2023-05-03 NOTE — ED Notes (Signed)
Pt asking for something to eat. Provided apple sauce and saltine crackers.

## 2023-05-03 NOTE — ED Provider Notes (Signed)
[place] EMERGENCY DEPARTMENT AT [facility] Provider Note   CSN: [id number] Arrival date & time: 05/03/23  1154     History  Chief Complaint  Patient presents with   Abdominal Pain    Jonathan Vazquez is a 22 y.o. male.  Patient is a 22 year old male who presents with abdominal cramping.  York Spaniel it started yesterday.  He was seen in the ED during the night and initially got better but returned because the abdominal pain returned.  He said he had associated large amounts of watery diarrhea yesterday.  It has slowed down today.  He has cramping and the urge to have a bowel movement but not much is coming out.  He has had some nausea and prior vomiting.  He has generalized abdominal cramping.  No known fevers.  He thinks he may have eaten some bad ranch and salad yesterday.  No prior history of significant abdominal issues in the past.       Home Medications Prior to Admission medications   Medication Sig Start Date End Date Taking? Authorizing Provider  dicyclomine (BENTYL) 20 MG tablet Take 1 tablet (20 mg total) by mouth 2 (two) times daily. 05/03/23  Yes Rolan Bucco, MD  acetaminophen (TYLENOL) 500 MG tablet Take 500 mg by mouth every 6 (six) hours as needed. For fever    [provider]  doxycycline (VIBRAMYCIN) 100 MG capsule Take 1 capsule (100 mg total) by mouth 2 (two) times daily. 07/09/21   Achille Rich, PA-C  ibuprofen (ADVIL,MOTRIN) 200 MG tablet Take 400 mg by mouth every 6 (six) hours as needed.    [provider]  naproxen (NAPROSYN) 375 MG tablet Take 1 tablet (375 mg total) by mouth 2 (two) times daily. 02/18/18   Wurst, [place], PA-C  ondansetron (ZOFRAN) 4 MG tablet Take 1 tablet (4 mg total) by mouth every 6 (six) hours. 05/03/23   Prosperi, Christian H, PA-C      Allergies    Patient has no known allergies.    Review of Systems   Review of Systems  Constitutional:  Negative for chills, diaphoresis, fatigue and fever.  HENT:   Negative for congestion, rhinorrhea and sneezing.   Eyes: Negative.   Respiratory:  Negative for cough, chest tightness and shortness of breath.   Cardiovascular:  Negative for chest pain and leg swelling.  Gastrointestinal:  Positive for abdominal pain, diarrhea, nausea and vomiting. Negative for blood in stool.  Genitourinary:  Negative for difficulty urinating, flank pain, frequency and hematuria.  Musculoskeletal:  Negative for arthralgias and back pain.  Skin:  Negative for rash.  Neurological:  Negative for dizziness, speech difficulty, weakness, numbness and headaches.    Physical Exam Updated Vital Signs BP 122/68 (BP Location: Right Arm)   Pulse (!) 50   Temp 98.8 F (37.1 C) (Oral)   Resp 18   Ht 5\' 4"  (1.626 m)   Wt 56.7 kg   SpO2 99%   BMI 21.46 kg/m  Physical Exam Constitutional:      Appearance: He is well-developed.  HENT:     Head: Normocephalic and atraumatic.  Eyes:     Pupils: Pupils are equal, round, and reactive to light.  Cardiovascular:     Rate and Rhythm: Normal rate and regular rhythm.     Heart sounds: Normal heart sounds.  Pulmonary:     Effort: Pulmonary effort is normal. No respiratory distress.     Breath sounds: Normal breath sounds. No wheezing or rales.  Chest:     Chest wall: No tenderness.  Abdominal:     General: Bowel sounds are normal.     Palpations: Abdomen is soft.     Tenderness: There is generalized abdominal tenderness. There is no guarding or rebound.  Musculoskeletal:        General: Normal range of motion.     Cervical back: Normal range of motion and neck supple.  Lymphadenopathy:     Cervical: No cervical adenopathy.  Skin:    General: Skin is warm and dry.     Findings: No rash.  Neurological:     Mental Status: He is alert and oriented to person, place, and time.     ED Results / Procedures / Treatments   Labs (all labs ordered are listed, but only abnormal results are displayed) Labs Reviewed  URINALYSIS,  W/ REFLEX TO CULTURE (INFECTION SUSPECTED) - Abnormal; Notable for the following components:      Result Value   Color, Urine STRAW (*)    Ketones, ur 20 (*)    Bacteria, UA RARE (*)    All other components within normal limits    EKG None  Radiology CT ABDOMEN PELVIS W CONTRAST  Result Date: 05/03/2023 CLINICAL DATA:  Abdominal pain EXAM: CT ABDOMEN AND PELVIS WITH CONTRAST TECHNIQUE: Multidetector CT imaging of the abdomen and pelvis was performed using the standard protocol following bolus administration of intravenous contrast. RADIATION DOSE REDUCTION: This exam was performed according to the departmental dose-optimization program which includes automated exposure control, adjustment of the mA and/or kV according to patient size and/or use of iterative reconstruction technique. CONTRAST:  OMNIPAQUE IOHEXOL 300 MG/ML  SOLN COMPARISON:  None Available. FINDINGS: Lower chest: No acute abnormality. Hepatobiliary: No focal liver abnormality. No gallstones identified. No significant pericholecystic inflammation. Mild fusiform dilatation of the CBD measures up to 7 mm proximally. Pancreas: Unremarkable. No pancreatic ductal dilatation or surrounding inflammatory changes. Spleen: Normal in size without focal abnormality. Adrenals/Urinary Tract: Normal adrenal glands. No kidney mass or hydronephrosis. No nephrolithiasis. Urinary bladder is unremarkable. Stomach/Bowel: Stomach is normal. The appendix is visualized and appears within normal limits. No pericecal inflammation. No pathologic dilatation of the large or small bowel loops. No bowel wall thickening or inflammatory change. Vascular/Lymphatic: Patent upper abdominal vascularity. Normal caliber of the abdominal aorta. No signs of abdominopelvic adenopathy. Reproductive: Prostate is unremarkable. Other: Trace free fluid identified within the dependent portion of the pelvis. No focal fluid collections. Musculoskeletal: No acute or significant  osseous findings. IMPRESSION: 1. No acute findings within the abdomen or pelvis. 2. Trace free fluid identified within the dependent portion of the pelvis. This is nonspecific and may be physiologic. 3. Mild fusiform dilatation of the CBD measures up to 7 mm proximally. No gallstones identified. No significant pericholecystic inflammation. Electronically Signed   By: Signa Kell M.D.   On: 05/03/2023 06:47    Procedures Procedures    Medications Ordered in ED Medications  sodium chloride 0.9 % bolus 1,000 mL (0 mLs Intravenous Stopped 05/03/23 1440)  ondansetron (ZOFRAN) injection 4 mg (4 mg Intravenous Given 05/03/23 1250)  dicyclomine (BENTYL) injection 20 mg (20 mg Intramuscular Given 05/03/23 1250)  pantoprazole (PROTONIX) injection 40 mg (40 mg Intravenous Given 05/03/23 1250)    ED Course/ Medical Decision Making/ A&P                                 Medical Decision  Making Risk Prescription drug management.   Patient is a 22 year old male who presents with abdominal cramping.  He was seen in the ED during the night and early morning hours.  He had labs and CT scan which were reviewed by me and are nonconcerning.  He had some ongoing cramping in his abdomen which prompted him to come back to the emergency department.  He is also having some ongoing small loose stools and some vomiting.  He is afebrile.  He was given some IV fluids and Bentyl.  He says he feels much better after this and is able to drink fluids without any ongoing symptoms.  He was discharged home in good condition.  He had already been given a prescription for Zofran at his last visit.  I did send in a prescription for Bentyl.  He was given food choices to use for bowel rest.  He was discharged home in good condition.  Return precautions were given.  Final Clinical Impression(s) / ED Diagnoses Final diagnoses:  Nausea vomiting and diarrhea  Generalized abdominal pain    Rx / DC Orders ED Discharge Orders           Ordered    dicyclomine (BENTYL) 20 MG tablet  2 times daily        05/03/23 1509              Rolan Bucco, MD 05/03/23 1513

## 2023-05-03 NOTE — ED Notes (Signed)
Patient got up and used the bathroom. Has no complaints at this time.

## 2023-05-03 NOTE — ED Provider Notes (Signed)
Accepted handoff at shift change from Army Melia PA-C. Please see prior provider note for more detail.   Briefly: Patient is 22 y.o.   DDX: concern for diffuse, most focally in the lower abdomen abdominal pain for 2 days with nausea, vomiting.  Of without being seen with normal lab work yesterday, pending CT abdomen pelvis at this time.  Feeling better after pain medication, nausea medication.  Plan: I dependently interpreted CT abdomen pelvis with contrast which shows some fusiform dilation of the common bile duct, 7 mm, he has no focal right upper quadrant tenderness on exam, overall with low clinical suspicion for acute gallbladder pathology, but will provide gallbladder eating plan resources, and GI follow-up, encourage close follow-up especially if he begins develop right upper quadrant pain.  Overall patient endorsed eating some old the ranch that had been left out on the counter as well as some other abnormal foods for him, suspect he could have some degree of food poisoning/gastroenteritis.  Encouraged bland diet, plenty of fluids, and discussed extensive return precautions.  Patient understands and agrees with this plan.      RISR  EDTHIS    [place] 05/03/23 0815    Nira Conn, MD 05/03/23 [phone]

## 2023-05-03 NOTE — ED Provider Notes (Signed)
[place] EMERGENCY DEPARTMENT AT [facility] Provider Note   CSN: [id number] Arrival date & time: 05/03/23  0056     History  Chief Complaint  Patient presents with   Abdominal Pain    Jonathan Vazquez is a 22 y.o. male.  22 year old male with complaint of abdominal located across the lower abdomen and in the middle, onset at 6pm last night while at work. Pain is constant, worse with movement. Last bowel movement with red stools. With nausea and vomiting onset around 12am.  Last thc use around 2pm yesterday. Denies alcohol use. No sick contact. No prior abdominal surgeries.        Home Medications Prior to Admission medications   Medication Sig Start Date End Date Taking? Authorizing Provider  ondansetron (ZOFRAN) 4 MG tablet Take 1 tablet (4 mg total) by mouth every 6 (six) hours. 05/03/23  Yes Prosperi, Christian H, PA-C  acetaminophen (TYLENOL) 500 MG tablet Take 500 mg by mouth every 6 (six) hours as needed. For fever    [provider]  dicyclomine (BENTYL) 20 MG tablet Take 1 tablet (20 mg total) by mouth 2 (two) times daily. 05/03/23   Rolan Bucco, MD  doxycycline (VIBRAMYCIN) 100 MG capsule Take 1 capsule (100 mg total) by mouth 2 (two) times daily. 07/09/21   Achille Rich, PA-C  ibuprofen (ADVIL,MOTRIN) 200 MG tablet Take 400 mg by mouth every 6 (six) hours as needed.    [provider]  naproxen (NAPROSYN) 375 MG tablet Take 1 tablet (375 mg total) by mouth 2 (two) times daily. 02/18/18   Wurst, [place], PA-C      Allergies    Patient has no known allergies.    Review of Systems   Review of Systems Negative except as per HPI Physical Exam Updated Vital Signs BP 110/63 (BP Location: Left Arm)   Pulse 60   Temp 98.5 F (36.9 C) (Oral)   Resp 16   SpO2 99%  Physical Exam Vitals and nursing note reviewed.  Constitutional:      General: He is not in acute distress.    Appearance: He is well-developed. He is not diaphoretic.   HENT:     Head: Normocephalic and atraumatic.  Pulmonary:     Effort: Pulmonary effort is normal.  Abdominal:     Palpations: Abdomen is soft.     Tenderness: There is generalized abdominal tenderness. There is guarding.  Skin:    General: Skin is warm and dry.     Findings: No erythema or rash.  Neurological:     Mental Status: He is alert and oriented to person, place, and time.  Psychiatric:        Behavior: Behavior normal.     ED Results / Procedures / Treatments   Labs (all labs ordered are listed, but only abnormal results are displayed) Labs Reviewed - No data to display  EKG None  Radiology CT ABDOMEN PELVIS W CONTRAST  Result Date: 05/03/2023 CLINICAL DATA:  Abdominal pain EXAM: CT ABDOMEN AND PELVIS WITH CONTRAST TECHNIQUE: Multidetector CT imaging of the abdomen and pelvis was performed using the standard protocol following bolus administration of intravenous contrast. RADIATION DOSE REDUCTION: This exam was performed according to the departmental dose-optimization program which includes automated exposure control, adjustment of the mA and/or kV according to patient size and/or use of iterative reconstruction technique. CONTRAST:  OMNIPAQUE IOHEXOL 300 MG/ML  SOLN COMPARISON:  None Available. FINDINGS: Lower chest: No acute abnormality. Hepatobiliary: No  focal liver abnormality. No gallstones identified. No significant pericholecystic inflammation. Mild fusiform dilatation of the CBD measures up to 7 mm proximally. Pancreas: Unremarkable. No pancreatic ductal dilatation or surrounding inflammatory changes. Spleen: Normal in size without focal abnormality. Adrenals/Urinary Tract: Normal adrenal glands. No kidney mass or hydronephrosis. No nephrolithiasis. Urinary bladder is unremarkable. Stomach/Bowel: Stomach is normal. The appendix is visualized and appears within normal limits. No pericecal inflammation. No pathologic dilatation of the large or small bowel loops. No  bowel wall thickening or inflammatory change. Vascular/Lymphatic: Patent upper abdominal vascularity. Normal caliber of the abdominal aorta. No signs of abdominopelvic adenopathy. Reproductive: Prostate is unremarkable. Other: Trace free fluid identified within the dependent portion of the pelvis. No focal fluid collections. Musculoskeletal: No acute or significant osseous findings. IMPRESSION: 1. No acute findings within the abdomen or pelvis. 2. Trace free fluid identified within the dependent portion of the pelvis. This is nonspecific and may be physiologic. 3. Mild fusiform dilatation of the CBD measures up to 7 mm proximally. No gallstones identified. No significant pericholecystic inflammation. Electronically Signed   By: Signa Kell M.D.   On: 05/03/2023 06:47    Procedures Procedures    Medications Ordered in ED Medications  sodium chloride 0.9 % bolus 1,000 mL (0 mLs Intravenous Stopped 05/03/23 0403)  ondansetron (ZOFRAN) injection 4 mg (4 mg Intravenous Given 05/03/23 0225)  morphine (PF) 4 MG/ML injection 4 mg (4 mg Intravenous Given 05/03/23 0224)  iohexol (OMNIPAQUE) 300 MG/ML solution 100 mL (100 mLs Intravenous Contrast Given 05/03/23 0419)  morphine (PF) 2 MG/ML injection 2 mg (2 mg Intravenous Given 05/03/23 0558)    ED Course/ Medical Decision Making/ A&P Clinical Course as of 05/03/23 2207  Fri May 03, 2023  1610 Here last night and left -- getting CTAP, questionable exam was having a fair amount of guarding. Doesn't need hemoccult unless poops. [CP]    Clinical Course User Index [CP] Olene Floss, PA-C                                 Medical Decision Making Amount and/or Complexity of Data Reviewed Radiology: ordered.  Risk Prescription drug management.   This patient presents to the ED for concern of abdominal pain with vomiting, this involves an extensive number of treatment options, and is a complaint that carries with it a high risk of complications  and morbidity.  The differential diagnosis includes but not limited to appendicitis, constipation, cannabinol hyperemesis   Co morbidities that complicate the patient evaluation  Smokes marijuana, otherwise healthy   Additional history obtained:  External records from outside source obtained and reviewed including prior labs obtained yesterday prior to leaving   Lab Tests:  I Ordered, and personally interpreted labs.  The pertinent results include: CBC with mild leukocytosis 11,000.  CMP without significant findings.  Lipase within normal notes.   Imaging Studies ordered:  I ordered imaging studies including CT abdomen pelvis with contrast Imaging pending at time of signout to oncoming provider.    Problem List / ED Course / Critical interventions / Medication management  22 year old male presents emergency room with complaint of lower abdominal pain onset yesterday.  Patient came to the emergency room had labs done that were without significant findings and ultimately walked out.  He returns tonight due to worsening pain and now vomiting.  Found to have generalized abdominal discomfort with mild guarding.  CT abdomen pelvis obtained.  Reading is pending at time of signout to oncoming provider. I ordered medication including morphine, Zofran for pain Reevaluation of the patient after these medicines showed that the patient improved I have reviewed the patients home medicines and have made adjustments as needed   Social Determinants of Health:  No PCP on file   Test / Admission - Considered:  Disposition pending at time of signout to oncoming provider         Final Clinical Impression(s) / ED Diagnoses Final diagnoses:  Generalized abdominal pain  Common bile duct dilatation  Gastroenteritis    Rx / DC Orders ED Discharge Orders          Ordered    ondansetron (ZOFRAN) 4 MG tablet  Every 6 hours        05/03/23 0705              Jeannie Fend,  PA-C 05/03/23 2207    Nira Conn, MD 05/04/23 [phone]

## 2023-05-03 NOTE — ED Notes (Signed)
Patient Alert and oriented to baseline. Stable and ambulatory to baseline. Patient verbalized understanding of the discharge instructions.  Patient belongings were taken by the patient.
